# Patient Record
Sex: Male | Born: 1980 | Hispanic: Yes | Marital: Single | Smoking: Current every day smoker
Health system: Southern US, Community
[De-identification: ages and names within clinical notes are randomized; demographics above are authoritative.]

## PROBLEM LIST (undated history)

## (undated) HISTORY — PX: ABDOMINAL SURGERY: SHX537

---

## 2015-10-31 ENCOUNTER — Emergency Department (HOSPITAL_COMMUNITY)
Admission: EM | Admit: 2015-10-31 | Discharge: 2015-10-31 | Disposition: A | Discharge status: Home / Self Care | Payer: Self-pay | Attending: Emergency Medicine | Admitting: Emergency Medicine

## 2015-10-31 ENCOUNTER — Encounter (HOSPITAL_COMMUNITY): Payer: Self-pay

## 2015-10-31 ENCOUNTER — Emergency Department (HOSPITAL_COMMUNITY): Payer: Self-pay

## 2015-10-31 DIAGNOSIS — S6992XA Unspecified injury of left wrist, hand and finger(s), initial encounter: Secondary | ICD-10-CM

## 2015-10-31 DIAGNOSIS — W241XXA Contact with transmission devices, not elsewhere classified, initial encounter: Secondary | ICD-10-CM | POA: Insufficient documentation

## 2015-10-31 DIAGNOSIS — S60312A Abrasion of left thumb, initial encounter: Secondary | ICD-10-CM | POA: Insufficient documentation

## 2015-10-31 DIAGNOSIS — Y9389 Activity, other specified: Secondary | ICD-10-CM | POA: Insufficient documentation

## 2015-10-31 DIAGNOSIS — S62522A Displaced fracture of distal phalanx of left thumb, initial encounter for closed fracture: Secondary | ICD-10-CM | POA: Insufficient documentation

## 2015-10-31 DIAGNOSIS — Y9289 Other specified places as the place of occurrence of the external cause: Secondary | ICD-10-CM | POA: Insufficient documentation

## 2015-10-31 DIAGNOSIS — Y990 Civilian activity done for income or pay: Secondary | ICD-10-CM | POA: Insufficient documentation

## 2015-10-31 DIAGNOSIS — F17210 Nicotine dependence, cigarettes, uncomplicated: Secondary | ICD-10-CM | POA: Insufficient documentation

## 2015-10-31 DIAGNOSIS — S62639A Displaced fracture of distal phalanx of unspecified finger, initial encounter for closed fracture: Secondary | ICD-10-CM

## 2015-10-31 DIAGNOSIS — Z23 Encounter for immunization: Secondary | ICD-10-CM | POA: Insufficient documentation

## 2015-10-31 MED ORDER — IBUPROFEN 400 MG PO TABS
600.0000 mg | ORAL_TABLET | Freq: Once | ORAL | Status: AC
  Administered 2015-10-31: 600 mg via ORAL
  Filled 2015-10-31: qty 1

## 2015-10-31 MED ORDER — TETANUS-DIPHTH-ACELL PERTUSSIS 5-2.5-18.5 LF-MCG/0.5 IM SUSP
0.5000 mL | Freq: Once | INTRAMUSCULAR | Status: AC
  Administered 2015-10-31: 0.5 mL via INTRAMUSCULAR
  Filled 2015-10-31: qty 0.5

## 2015-10-31 NOTE — ED Notes (Signed)
Agree with CPA assessment. 

## 2015-10-31 NOTE — ED Provider Notes (Signed)
CSN: [id number]     Arrival date & time 10/31/15  1514 History  By signing my name below, I, Hector Ortiz, attest that this documentation has been prepared under the direction and in the presence of Federated Department StoresHanna Patel-Mills, PA-C. Electronically Signed: Placido SouLogan Ortiz, ED Scribe. 10/31/2015. 3:34 PM.   Chief Complaint  Patient presents with  . Extremity Pain   The history is provided by the patient and a friend. No language interpreter was used.    HPI Comments: Hector Ortiz is a 34 y.o. male who is right hand dominant presents to the Emergency Department complaining of constant, 7/10, left thumb pain with onset PTA. Pt's boss notes that they were lifting a concrete box with a chain and the effected thumb was caught in the hook of the chain resulting in his pain with an associated laceration with controlled bleeding to the affected thumb. He was wearing a glove at the time.  He notes worsening pain with movement of the thumb. He denies his TDAP is UTD. Pt denies any other associated symptoms at this time.   History reviewed. No pertinent past medical history. Past Surgical History  Procedure Laterality Date  . Abdominal surgery     History reviewed. No pertinent family history. Social History  Substance Use Topics  . Smoking status: Current Every Day Smoker -- 0.50 packs/day    Types: Cigarettes  . Smokeless tobacco: None  . Alcohol Use: Yes     Comment: socially    Review of Systems  Constitutional: Negative for fever.  Musculoskeletal: Positive for arthralgias.  Skin: Positive for wound.  Psychiatric/Behavioral: Negative for confusion.    Allergies  Review of patient's allergies indicates no known allergies.  Home Medications   Prior to Admission medications   Not on File   BP 128/61 mmHg  Pulse 67  Temp(Src) 98.8 F (37.1 C) (Oral)  Resp 22  SpO2 99% Physical Exam  Constitutional: He is oriented to person, place, and time. He appears well-developed and  well-nourished.  HENT:  Head: Normocephalic and atraumatic.  Mouth/Throat: No oropharyngeal exudate.  Neck: Normal range of motion. No tracheal deviation present.  Cardiovascular: Normal rate.   Pulmonary/Chest: Effort normal. No respiratory distress.  Abdominal: Soft. There is no tenderness.  Musculoskeletal: Normal range of motion.  Left Hand: 2+ radial pulse; no snuff box tenderness; able to flex and extend all fingers; No thumb deformity. Soft compartments. Small 1cm abrasion to thumb but no active bleeding.   Neurological: He is alert and oriented to person, place, and time.  Skin: Skin is warm and dry. He is not diaphoretic.  Psychiatric: He has a normal mood and affect. His behavior is normal.  Nursing note and vitals reviewed.  ED Course  Procedures  DIAGNOSTIC STUDIES: Oxygen Saturation is 99% on RA, normal by my interpretation.    COORDINATION OF CARE: 3:30 PM Pt presents today due to left thumb pain. Discussed next steps with pt including a DG of the thumb and reevaluation based on results of the imaging. He agreed to the plan.  Labs Review Labs Reviewed - No data to display  Imaging Review Dg Finger Thumb Left  10/31/2015  CLINICAL DATA:  Crush injury of the left thumb with soft tissue laceration EXAM: LEFT THUMB 2+V COMPARISON:  None. FINDINGS: There is a fracture through the distal aspect of the first proximal phalanx with some mild rotation of the fracture fragment. No other focal abnormality is seen. IMPRESSION: Fracture of the distal aspect of the first  proximal phalanx. Electronically Signed   By: Alcide Clever M.D.   On: 10/31/2015 16:11   I have personally reviewed and evaluated these images as part of my medical decision-making.   EKG Interpretation None      MDM   Final diagnoses:  Thumb injury, left, initial encounter  Phalanx, distal fracture of finger, closed, initial encounter  Patient presents for left thumb pain after getting it caught in a chain  with a piece of concrete attached. Vitals are stable and he is well-appearing. Patient X-Ray of the left thumb shows a fracture of the distal aspect of the first proximal phalanx. He is able to move the thumb and denies having any pain. He was put in a cock up finger splint. Pt advised to follow up with orthopedics. Conservative therapy such as ibuprofen or tylenol was discussed. Patient will be discharged home & is agreeable with above plan. Returns precautions discussed. Pt appears safe for discharge. Medications  ibuprofen (ADVIL,MOTRIN) tablet 600 mg (600 mg Oral Given 10/31/15 1533)  Tdap (BOOSTRIX) injection 0.5 mL (0.5 mLs Intramuscular Given 10/31/15 1601)   I personally performed the services described in this documentation, which was scribed in my presence. The recorded information has been reviewed and is accurate.    Catha Gosselin, PA-C 11/01/15 1610  Pricilla Loveless, MD 11/01/15 2330

## 2015-10-31 NOTE — Progress Notes (Signed)
Orthopedic Tech Progress Note Patient Details:  Hector SalterHeriberto Ortiz 08/17/1981 [id number]  Ortho Devices Type of Ortho Device: Finger splint Ortho Device/Splint Location: LUE thumb Ortho Device/Splint Interventions: Ordered, Application   Jennye MoccasinHughes, Wyley Hack Craig 10/31/2015, 5:05 PM

## 2015-10-31 NOTE — Discharge Instructions (Signed)
Follow-up with orthopedics. Take ibuprofen or Tylenol for pain. Rest and Ice the hand. Return for numbness or tingling, pallor, or increased pain in the hand.

## 2015-10-31 NOTE — ED Notes (Signed)
Pt was at work when his left thumb got caught in a chain.

## 2017-05-06 IMAGING — DX DG FINGER THUMB 2+V*L*
3 series · 3 of 3 positions shown · non-contrast
Comparison: None.

CLINICAL DATA: Crush injury of the left thumb with soft tissue
laceration

EXAM:
LEFT THUMB 2+V

[x finger pa left]
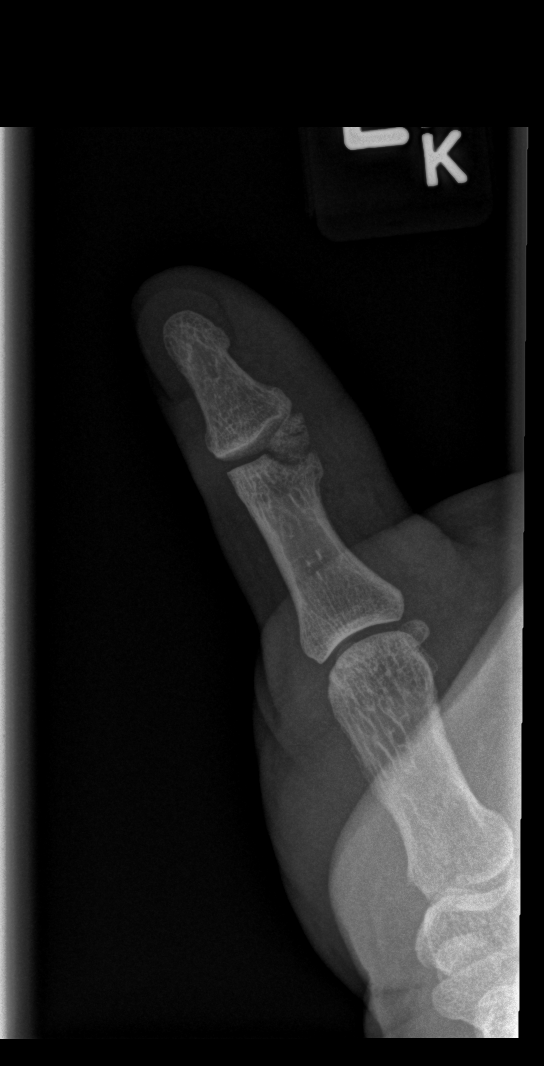

[x finger obl left]
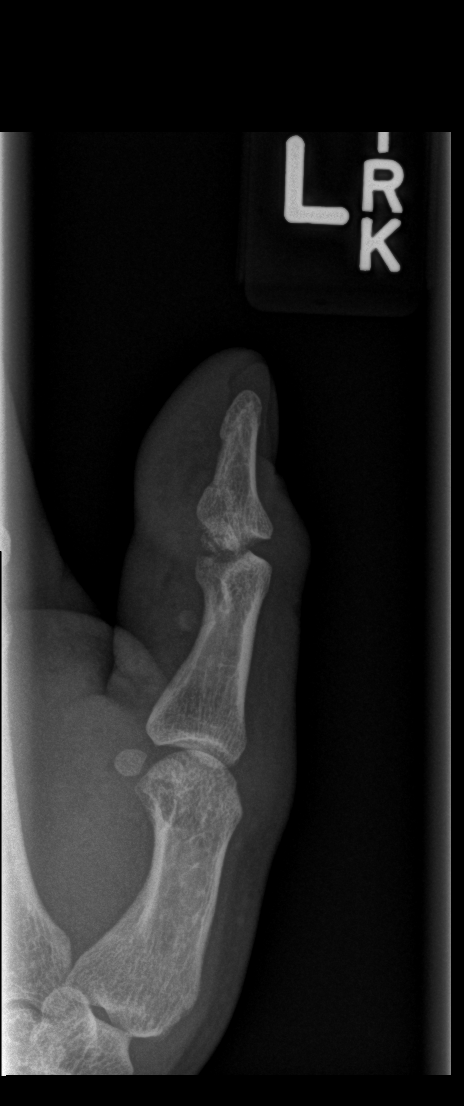

[x finger lat left]
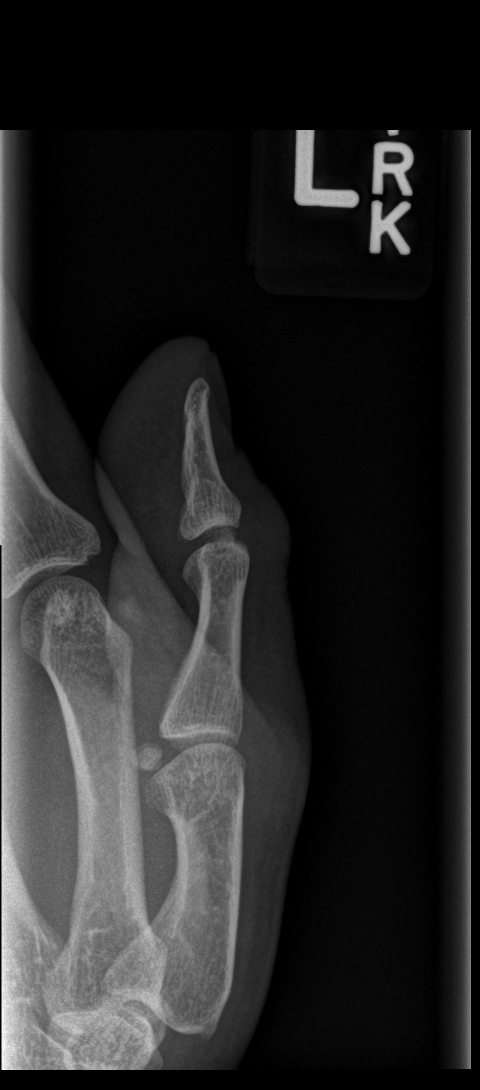

[3 of 3 positions shown; findings below may reference images not displayed]

FINDINGS: There is a fracture through the distal aspect of the first proximal
phalanx with some mild rotation of the fracture fragment. No other
focal abnormality is seen.
IMPRESSION: Fracture of the distal aspect of the first proximal phalanx.
